# Patient Record
Sex: Male | Born: 2000 | Race: Black or African American | Hispanic: No | Marital: Single
Health system: Southern US, Community
[De-identification: ages and names within clinical notes are randomized; demographics above are authoritative.]

## PROBLEM LIST (undated history)

## (undated) DIAGNOSIS — F419 Anxiety disorder, unspecified: Secondary | ICD-10-CM

## (undated) DIAGNOSIS — F913 Oppositional defiant disorder: Secondary | ICD-10-CM

## (undated) DIAGNOSIS — F909 Attention-deficit hyperactivity disorder, unspecified type: Secondary | ICD-10-CM

## (undated) DIAGNOSIS — E669 Obesity, unspecified: Secondary | ICD-10-CM

## (undated) DIAGNOSIS — F988 Other specified behavioral and emotional disorders with onset usually occurring in childhood and adolescence: Secondary | ICD-10-CM

## (undated) DIAGNOSIS — Z8669 Personal history of other diseases of the nervous system and sense organs: Secondary | ICD-10-CM

## (undated) HISTORY — PX: TONSILLECTOMY: SUR1361

## (undated) HISTORY — PX: ADENOIDECTOMY: SHX5191

---
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
## 2021-01-31 ENCOUNTER — Emergency Department (HOSPITAL_COMMUNITY)
Admission: EM | Admit: 2021-01-31 | Discharge: 2021-01-31 | Disposition: A | Discharge status: Home / Self Care | Payer: Medicaid Other | Attending: Emergency Medicine | Admitting: Emergency Medicine

## 2021-01-31 ENCOUNTER — Other Ambulatory Visit: Payer: Self-pay

## 2021-01-31 ENCOUNTER — Encounter (HOSPITAL_COMMUNITY): Payer: Self-pay | Admitting: Emergency Medicine

## 2021-01-31 ENCOUNTER — Emergency Department (HOSPITAL_COMMUNITY): Payer: Medicaid Other

## 2021-01-31 DIAGNOSIS — Z5321 Procedure and treatment not carried out due to patient leaving prior to being seen by health care provider: Secondary | ICD-10-CM | POA: Insufficient documentation

## 2021-01-31 DIAGNOSIS — R1011 Right upper quadrant pain: Secondary | ICD-10-CM | POA: Diagnosis not present

## 2021-01-31 LAB — COMPREHENSIVE METABOLIC PANEL
ALT: 34 U/L (ref 0–44)
AST: 58 U/L — ABNORMAL HIGH (ref 15–41)
Albumin: 3.5 g/dL (ref 3.5–5.0)
Alkaline Phosphatase: 71 U/L (ref 38–126)
Anion gap: 9 (ref 5–15)
BUN: 10 mg/dL (ref 6–20)
CO2: 26 mmol/L (ref 22–32)
Calcium: 9.2 mg/dL (ref 8.9–10.3)
Chloride: 102 mmol/L (ref 98–111)
Creatinine, Ser: 1.2 mg/dL (ref 0.61–1.24)
GFR, Estimated: >60 mL/min (ref >60)
Glucose, Bld: 120 mg/dL — ABNORMAL HIGH (ref 70–99)
Potassium: 4 mmol/L (ref 3.5–5.1)
Sodium: 137 mmol/L (ref 135–145)
Total Bilirubin: 0.7 mg/dL (ref 0.3–1.2)
Total Protein: 6.6 g/dL (ref 6.5–8.1)

## 2021-01-31 LAB — CBC WITH DIFFERENTIAL/PLATELET
Abs Immature Granulocytes: 0.05 10*3/uL (ref 0.00–0.07)
Basophils Absolute: 0.1 10*3/uL (ref 0.0–0.1)
Basophils Relative: 1 %
Eosinophils Absolute: 0.5 10*3/uL (ref 0.0–0.5)
Eosinophils Relative: 5 %
HCT: 46.4 % (ref 39.0–52.0)
Hemoglobin: 15.1 g/dL (ref 13.0–17.0)
Immature Granulocytes: 1 %
Lymphocytes Relative: 25 %
Lymphs Abs: 2.4 10*3/uL (ref 0.7–4.0)
MCH: 28.4 pg (ref 26.0–34.0)
MCHC: 32.5 g/dL (ref 30.0–36.0)
MCV: 87.4 fL (ref 80.0–100.0)
Monocytes Absolute: 0.6 10*3/uL (ref 0.1–1.0)
Monocytes Relative: 6 %
Neutro Abs: 6.3 10*3/uL (ref 1.7–7.7)
Neutrophils Relative %: 62 %
Platelets: 331 10*3/uL (ref 150–400)
RBC: 5.31 MIL/uL (ref 4.22–5.81)
RDW: 14.1 % (ref 11.5–15.5)
WBC: 9.9 10*3/uL (ref 4.0–10.5)
nRBC: 0 % (ref 0.0–0.2)

## 2021-01-31 LAB — LIPASE, BLOOD: Lipase: 39 U/L (ref 11–51)

## 2021-01-31 NOTE — ED Triage Notes (Signed)
Patient with abdominal pain.  Patient having right upper quadrant pain.  Patient just came back from [place] and has had GI problems since coming back.  No nausea or vomiting.

## 2021-01-31 NOTE — ED Notes (Signed)
Called x3 for vitals with no response. Moving pt OTF at this time.

## 2021-01-31 NOTE — ED Provider Notes (Signed)
Emergency Medicine Provider Triage Evaluation Note  Johnny Galloway , a 20 y.o. male  was evaluated in triage.  Pt complains of RUQ pain.  Just returned from [place] 3.5 weeks ago and has had problems since.  No nausea/vomiting.  No fevers.  No hx of gallbladder disease.  Review of Systems  Positive: RUQ pain Negative: Nausea, vomiting, fever  Physical Exam  BP 118/70 (BP Location: Left Arm)   Pulse 65   Temp 98.3 F (36.8 C)   Resp 18   SpO2 100%  Gen:   Awake, no distress   Resp:  Normal effort  MSK:   Moves extremities without difficulty  Other:  Tender RUQ  Medical Decision Making  Medically screening exam initiated at 4:57 AM.  Appropriate orders placed.  GRAYLON AMORY was informed that the remainder of the evaluation will be completed by another provider, this initial triage assessment does not replace that evaluation, and the importance of remaining in the ED until their evaluation is complete.  Labs, RUQ [place].   Garlon Hatchet, PA-C 01/31/21 0505    Gilda Crease, MD 01/31/21 [phone]
